# Patient Record
Sex: Male | Born: 1960 | Race: White | Hispanic: No | Marital: Married | Smoking: Never smoker
Health system: Southern US, Community
[De-identification: ages and names within clinical notes are randomized; demographics above are authoritative.]

## PROBLEM LIST (undated history)

## (undated) DIAGNOSIS — M1990 Unspecified osteoarthritis, unspecified site: Secondary | ICD-10-CM

## (undated) DIAGNOSIS — R7303 Prediabetes: Secondary | ICD-10-CM

## (undated) DIAGNOSIS — C801 Malignant (primary) neoplasm, unspecified: Secondary | ICD-10-CM

## (undated) HISTORY — PX: COLONOSCOPY: SHX174

## (undated) HISTORY — PX: SURGERY OF LIP: SUR1315

## (undated) HISTORY — PX: MASS EXCISION: SHX2000

## (undated) HISTORY — PX: RHINOPLASTY: SHX2354

---

## 2007-05-09 ENCOUNTER — Ambulatory Visit: Payer: Self-pay | Admitting: Unknown Physician Specialty

## 2008-01-08 ENCOUNTER — Ambulatory Visit: Payer: Self-pay | Admitting: Otolaryngology

## 2008-02-05 ENCOUNTER — Ambulatory Visit: Payer: Self-pay | Admitting: Otolaryngology

## 2018-02-27 ENCOUNTER — Other Ambulatory Visit: Payer: Self-pay

## 2018-02-27 ENCOUNTER — Emergency Department
Admission: EM | Admit: 2018-02-27 | Discharge: 2018-02-27 | Disposition: A | Discharge status: Home / Self Care | Payer: 59 | Attending: Emergency Medicine | Admitting: Emergency Medicine

## 2018-02-27 ENCOUNTER — Encounter: Payer: Self-pay | Admitting: Emergency Medicine

## 2018-02-27 ENCOUNTER — Emergency Department: Payer: 59

## 2018-02-27 DIAGNOSIS — F17210 Nicotine dependence, cigarettes, uncomplicated: Secondary | ICD-10-CM | POA: Diagnosis not present

## 2018-02-27 DIAGNOSIS — R0789 Other chest pain: Secondary | ICD-10-CM | POA: Diagnosis not present

## 2018-02-27 LAB — CBC
HEMATOCRIT: 45.7 % (ref 40.0–52.0)
HEMOGLOBIN: 15.1 g/dL (ref 13.0–18.0)
MCH: 30.2 pg (ref 26.0–34.0)
MCHC: 33 g/dL (ref 32.0–36.0)
MCV: 91.5 fL (ref 80.0–100.0)
PLATELETS: 296 10*3/uL (ref 150–440)
RBC: 4.99 MIL/uL (ref 4.40–5.90)
RDW: 13.1 % (ref 11.5–14.5)
WBC: 7.3 10*3/uL (ref 3.8–10.6)

## 2018-02-27 LAB — BASIC METABOLIC PANEL
Anion gap: 8 (ref 5–15)
BUN: 21 mg/dL — ABNORMAL HIGH (ref 6–20)
CHLORIDE: 103 mmol/L (ref 101–111)
CO2: 26 mmol/L (ref 22–32)
CREATININE: 1.15 mg/dL (ref 0.61–1.24)
Calcium: 9.8 mg/dL (ref 8.9–10.3)
GFR calc non Af Amer: >60 mL/min (ref >60)
Glucose, Bld: 135 mg/dL — ABNORMAL HIGH (ref 65–99)
POTASSIUM: 4.4 mmol/L (ref 3.5–5.1)
SODIUM: 137 mmol/L (ref 135–145)

## 2018-02-27 LAB — FIBRIN DERIVATIVES D-DIMER (ARMC ONLY): Fibrin derivatives D-dimer (ARMC): 430.94 ng/mL (FEU) (ref 0.00–499.00)

## 2018-02-27 LAB — TROPONIN I: Troponin I: <0.03 ng/mL (ref <0.03)

## 2018-02-27 NOTE — ED Provider Notes (Signed)
[facility] Emergency Department Provider Note ____________________________________________   I have reviewed the triage vital signs and the triage nursing note.  HISTORY  Chief Complaint Chest Pain   Historian Patient  HPI Mike Moss is a 57 y.o. male with no significant past medical history, does not have a primary care doctor, presenting for chest pain and some shortness of breath since yesterday.  Not associated with exertion.  He does work as a Curator, does not report any known overuse or traumatic injury.  States that yesterday he had some pain over the left anterior chest wall which seem to be worse with taking a big deep breath.  Not exactly short of breath, but did report slight catch.  No fever.  No coughing.  No wheezing.  He is never had this before.  No nasal congestion.  No vomiting.  No abdominal pain.   History reviewed. No pertinent past medical history.  There are no active problems to display for this patient.   History reviewed. No pertinent surgical history.  Prior to Admission medications   Not on File    No Known Allergies  No family history on file.  Social History Social History   Tobacco Use  . Smoking status: Current Every Day Smoker    Packs/day: 0.50    Types: Cigarettes  . Smokeless tobacco: Never Used  Substance Use Topics  . Alcohol use: Not on file  . Drug use: Not on file    Review of Systems  Constitutional: Negative for fever. Eyes: Negative for visual changes. ENT: Negative for sore throat. Cardiovascular: Positive as per HPI for chest pain/pleuritic discomfort. Respiratory: Negative for coughing or hemoptysis. Gastrointestinal: Negative for abdominal pain, vomiting and diarrhea. Genitourinary: Negative for dysuria. Musculoskeletal: Negative for back pain. Skin: Negative for rash. Neurological: Negative for headache.  ____________________________________________   PHYSICAL EXAM:  VITAL  SIGNS: ED Triage Vitals  Enc Vitals Group     BP 02/27/18 0853 124/82     Pulse Rate 02/27/18 0853 61     Resp 02/27/18 0853 18     Temp 02/27/18 0853 (!) 97.5 F (36.4 C)     Temp Source 02/27/18 0853 Oral     SpO2 02/27/18 0853 98 %     Weight 02/27/18 0852 230 lb (104.3 kg)     Height 02/27/18 0852 6' (1.829 m)     Head Circumference --      Peak Flow --      Pain Score 02/27/18 0852 4     Pain Loc --      Pain Edu? --      Excl. in GC? --      Constitutional: Alert and oriented. Well appearing and in no distress. HEENT   Head: Normocephalic and atraumatic.      Eyes: Conjunctivae are normal. Pupils equal and round.       Ears:         Nose: No congestion/rhinnorhea.   Mouth/Throat: Mucous membranes are moist.   Neck: No stridor. Cardiovascular/Chest: Normal rate, regular rhythm.  No murmurs, rubs, or gallops.  Normal appearance to the chest wall without skin rash.  No tender muscle spasm or chest wall to palpation. Respiratory: Normal respiratory effort without tachypnea nor retractions. Breath sounds are clear and equal bilaterally. No wheezes/rales/rhonchi.   Gastrointestinal: Soft. No distention, no guarding, no rebound. Nontender.    Genitourinary/rectal:Deferred Musculoskeletal: Nontender with normal range of motion in all extremities. No joint effusions.  No lower  extremity tenderness.  No edema. Neurologic:  Normal speech and language. No gross or focal neurologic deficits are appreciated. Skin:  Skin is warm, dry and intact. No rash noted. Psychiatric: Mood and affect are normal. Speech and behavior are normal. Patient exhibits appropriate insight and judgment.   ____________________________________________  LABS (pertinent positives/negatives) I, Governor Rooksebecca Korinna Tat, MD the attending physician have reviewed the labs noted below.  Labs Reviewed  BASIC METABOLIC PANEL - Abnormal; Notable for the following components:      Result Value   Glucose, Bld 135 (*)     BUN 21 (*)    All other components within normal limits  CBC  TROPONIN I  FIBRIN DERIVATIVES D-DIMER ([facility] ONLY)    ____________________________________________    EKG I, Governor Rooksebecca Chella Chapdelaine, MD, the attending physician have personally viewed and interpreted all ECGs.  60 bpm.  Normal sinus rhythm.  Narrow QRS.  Normal axis.  Normal ST and T wave ____________________________________________  RADIOLOGY   Chest x-ray reviewed by me: No infiltrate or edema  Interpretation by radiologist:IMPRESSION: No active cardiopulmonary disease. __________________________________________  PROCEDURES  Procedure(s) performed: None  Critical Care performed: None   ____________________________________________  ED COURSE / ASSESSMENT AND PLAN  Pertinent labs & imaging results that were available during my care of the patient were reviewed by me and considered in my medical decision making (see chart for details).   Patient with a nonspecific chest discomfort complaint, with stable vital signs.  No hypoxia.  Chest x-ray clear.  Symptoms do not seem consistent with bronchitis or asthma clinically.  Some pleuritic component, question chest wall versus PE although PE seems relatively unlikely.  I discussed with him initially going ahead with a chest CT to really put the issue to rest.  After discussion of risk and benefit of radiation exposure and risk analysis, patient would not like to do a CT today and would rather proceed with d-dimer testing I think this is reasonable he is in a low risk low likelihood category.  We will use d-dimer for risk stratification.  I am going to recommend he follow with a cardiologist, he does not have a primary care doctor and I will recommend a primary care doctor to follow-up with.  Ddimer negative.  Again discussed with patient, ok for outpatient follow up with office numbers provided.  DIFFERENTIAL DIAGNOSIS: Differential diagnosis includes, but is not  limited to, ACS, aortic dissection, pulmonary embolism, cardiac tamponade, pneumothorax, pneumonia, pericarditis, myocarditis, GI-related causes including esophagitis/gastritis, and musculoskeletal chest wall pain.    CONSULTATIONS: None   Patient / Family / Caregiver informed of clinical course, medical decision-making process, and agree with plan.   I discussed return precautions, follow-up instructions, and discharge instructions with patient and/or family.  Discharge Instructions : You are evaluated for chest discomfort, and although no certain cause was found, your exam and evaluation are overall reassuring in the emergency department today.  Return to the emergency room immediately for any worsening chest pain, palpitations, dizziness or lightheadedness, weakness, numbness, passing out, pain with breathing, coughing up blood, fever, or any other symptoms concerning to you.    ___________________________________________   FINAL CLINICAL IMPRESSION(S) / ED DIAGNOSES   Final diagnoses:  Atypical chest pain      ___________________________________________        Note: This dictation was prepared with Dragon dictation. Any transcriptional errors that result from this process are unintentional    Governor RooksLord, Jordanna Hendrie, MD 02/27/18 1101

## 2018-02-27 NOTE — ED Notes (Signed)
Patient ambulatory to lobby steady gait, NAD noted. Verbalized understanding of discharge instructions and follow-up care.

## 2018-02-27 NOTE — ED Triage Notes (Signed)
Pt c/o left side chest pain, states every time he takes a deep breath there is pain. When he coughs there is pain that radiates from left chest down left arm.   Pt reports shortness of breath yesterday, none currently. Ambulatory without difficulty in triage. NAD.

## 2018-02-27 NOTE — Discharge Instructions (Signed)
You are evaluated for chest discomfort, and although no certain cause was found, your exam and evaluation are overall reassuring in the emergency department today.  Return to the emergency room immediately for any worsening chest pain, palpitations, dizziness or lightheadedness, weakness, numbness, passing out, pain with breathing, coughing up blood, fever, or any other symptoms concerning to you.

## 2019-08-12 IMAGING — CR DG CHEST 2V
1 series · 2 of 2 positions shown · non-contrast
Comparison: None.

CLINICAL DATA: chest pain and shortness of breath

EXAM:
CHEST  2 VIEW

[Series 1: dg chest 2 view · 0.14mm/px · 2 of 2 slices shown]
[im 1/2]
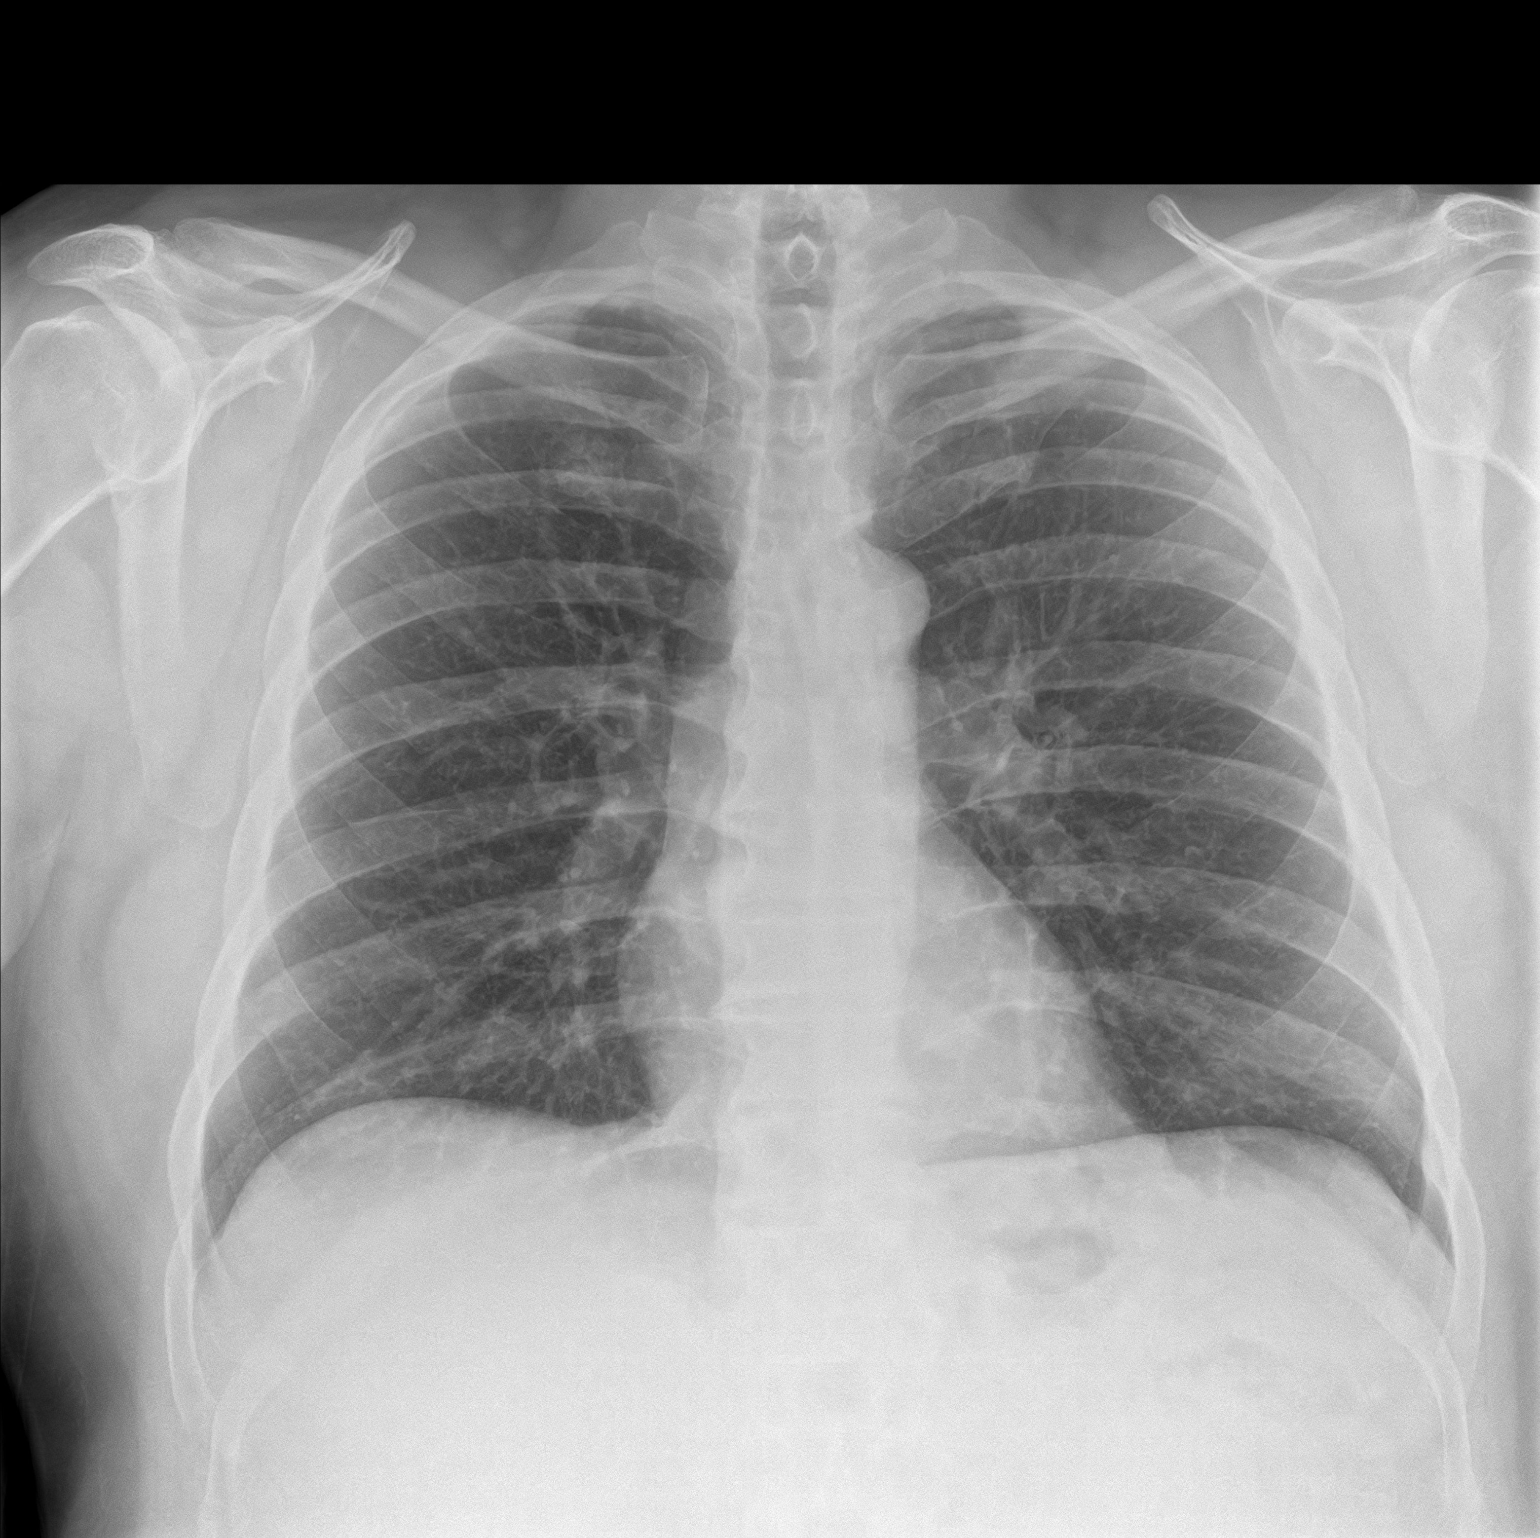
[im 2/2]
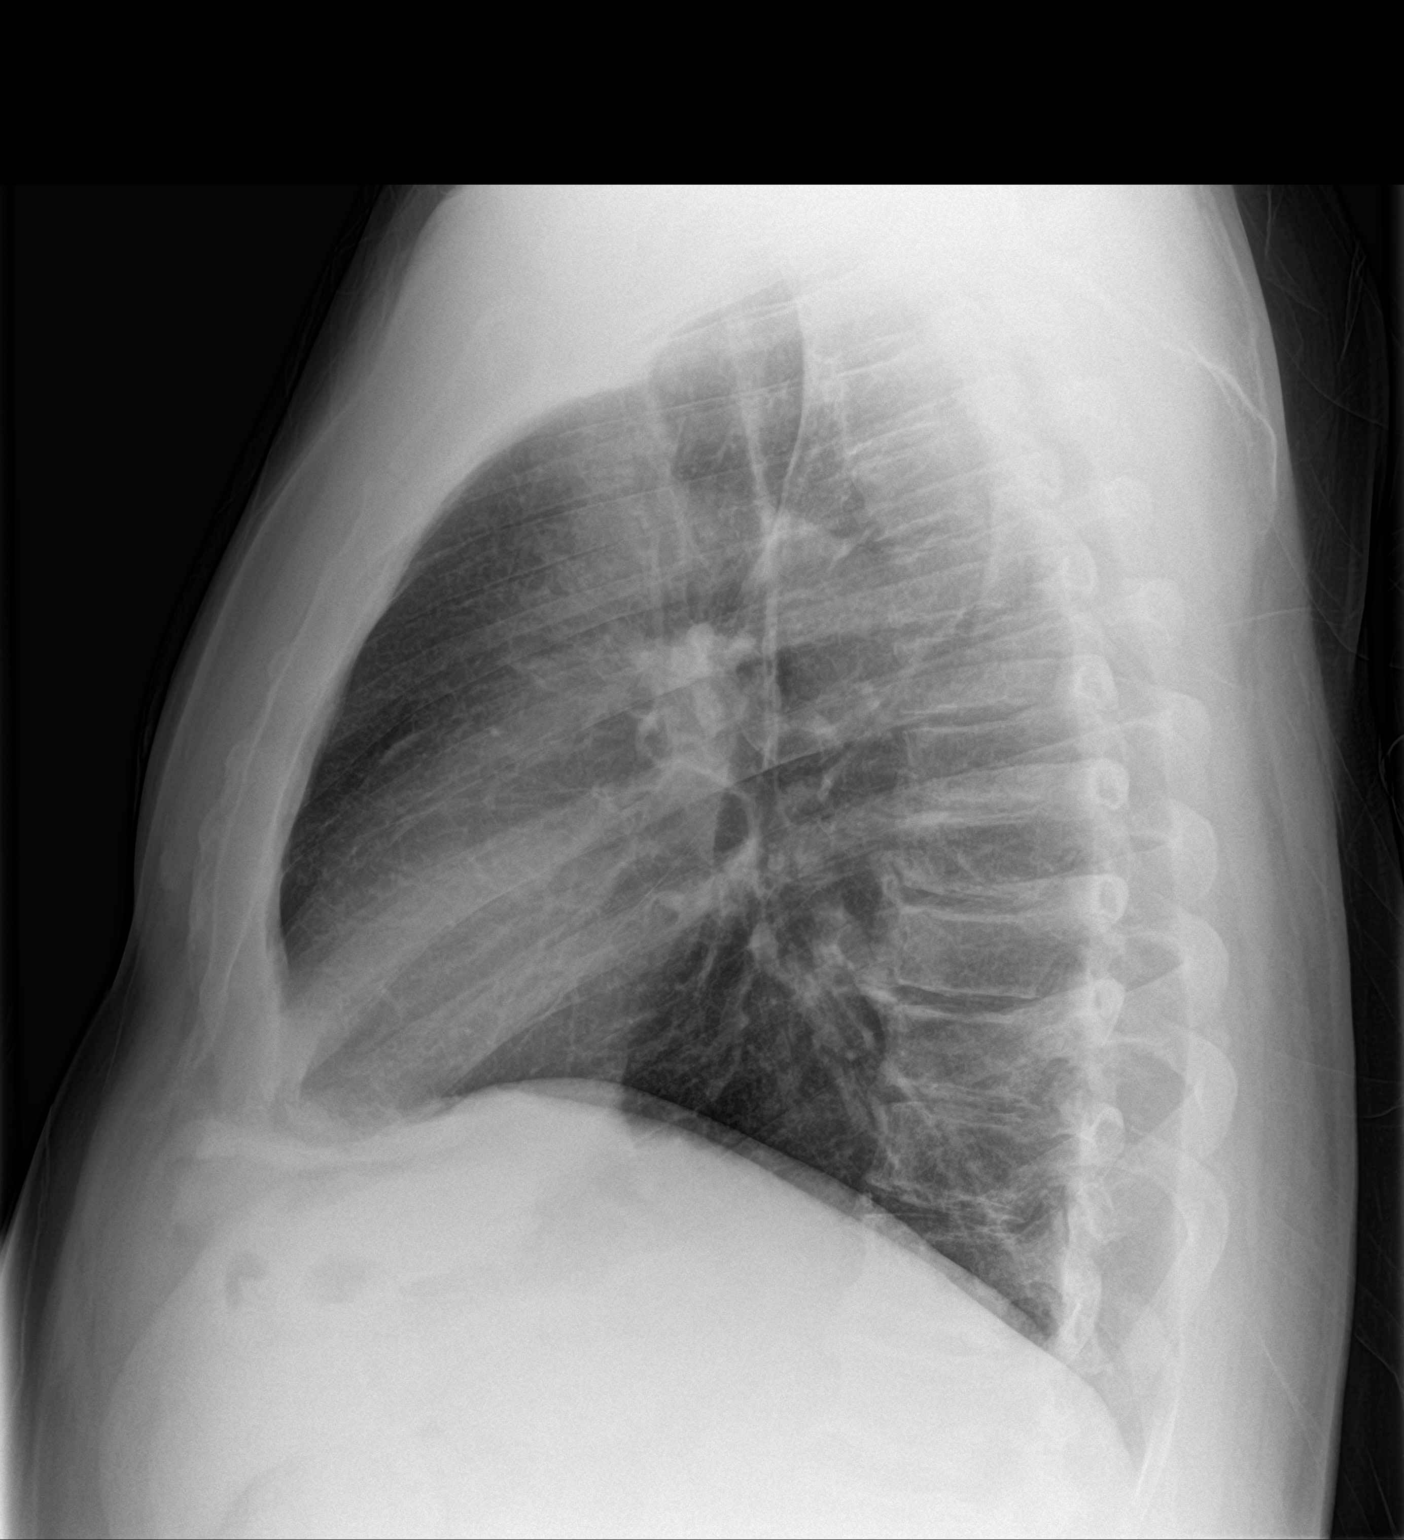

[2 of 2 positions shown; findings below may reference images not displayed]

FINDINGS: Normal heart size. Lungs clear. No pneumothorax. No pleural
effusion.
IMPRESSION: No active cardiopulmonary disease.

## 2024-08-12 ENCOUNTER — Ambulatory Visit
Admission: RE | Admit: 2024-08-12 | Discharge: 2024-08-12 | Disposition: A | Discharge status: Home / Self Care | Source: Ambulatory Visit

## 2024-08-12 ENCOUNTER — Other Ambulatory Visit: Payer: Self-pay

## 2024-08-12 DIAGNOSIS — R10813 Right lower quadrant abdominal tenderness: Secondary | ICD-10-CM | POA: Diagnosis present

## 2024-09-24 ENCOUNTER — Encounter: Payer: Self-pay | Admitting: Gastroenterology

## 2024-09-25 ENCOUNTER — Encounter: Payer: Self-pay | Admitting: Gastroenterology

## 2024-09-28 NOTE — Anesthesia Preprocedure Evaluation (Signed)
 Anesthesia Evaluation  Patient identified by MRN, date of birth, ID band Patient awake    Reviewed: Allergy & Precautions, H&P , NPO status , Patient's Chart, lab work & pertinent test results  Airway Mallampati: II  TM Distance: >3 FB Neck ROM: Full    Dental no notable dental hx.    Pulmonary neg pulmonary ROS   Pulmonary exam normal breath sounds clear to auscultation       Cardiovascular negative cardio ROS Normal cardiovascular exam Rhythm:Regular Rate:Normal     Neuro/Psych negative neurological ROS  negative psych ROS   GI/Hepatic negative GI ROS, Neg liver ROS,,,  Endo/Other  negative endocrine ROS    Renal/GU negative Renal ROS  negative genitourinary   Musculoskeletal negative musculoskeletal ROS (+) Arthritis ,    Abdominal   Peds negative pediatric ROS (+)  Hematology negative hematology ROS (+)   Anesthesia Other Findings Pre-diabetes Arthritis Cancer  Patient states   Reproductive/Obstetrics negative OB ROS                              Anesthesia Physical Anesthesia Plan  ASA: 3  Anesthesia Plan: General   Post-op Pain Management:    Induction: Intravenous  PONV Risk Score and Plan:   Airway Management Planned: Natural Airway and Nasal Cannula  Additional Equipment:   Intra-op Plan:   Post-operative Plan:   Informed Consent: I have reviewed the patients History and Physical, chart, labs and discussed the procedure including the risks, benefits and alternatives for the proposed anesthesia with the patient or authorized representative who has indicated his/her understanding and acceptance.     Dental Advisory Given  Plan Discussed with: Anesthesiologist, CRNA and Surgeon  Anesthesia Plan Comments: (Patient consented for risks of anesthesia including but not limited to:  - adverse reactions to medications - risk of airway placement if required -  damage to eyes, teeth, lips or other oral mucosa - nerve damage due to positioning  - sore throat or hoarseness - Damage to heart, brain, nerves, lungs, other parts of body or loss of life  Patient voiced understanding and assent.)        Anesthesia Quick Evaluation

## 2024-09-29 ENCOUNTER — Ambulatory Visit: Payer: Self-pay | Admitting: Anesthesiology

## 2024-09-29 ENCOUNTER — Encounter
Admission: RE | Disposition: A | Discharge status: Home / Self Care | Payer: Self-pay | Source: Home / Self Care | Attending: Gastroenterology

## 2024-09-29 ENCOUNTER — Other Ambulatory Visit: Payer: Self-pay

## 2024-09-29 ENCOUNTER — Encounter: Payer: Self-pay | Admitting: Gastroenterology

## 2024-09-29 ENCOUNTER — Ambulatory Visit
Admission: RE | Admit: 2024-09-29 | Discharge: 2024-09-29 | Disposition: A | Discharge status: Home / Self Care | Attending: Gastroenterology | Admitting: Gastroenterology

## 2024-09-29 DIAGNOSIS — Z1211 Encounter for screening for malignant neoplasm of colon: Secondary | ICD-10-CM | POA: Diagnosis present

## 2024-09-29 DIAGNOSIS — Z5329 Procedure and treatment not carried out because of patient's decision for other reasons: Secondary | ICD-10-CM | POA: Diagnosis not present

## 2024-09-29 HISTORY — DX: Unspecified osteoarthritis, unspecified site: M19.90

## 2024-09-29 HISTORY — DX: Prediabetes: R73.03

## 2024-09-29 HISTORY — DX: Malignant (primary) neoplasm, unspecified: C80.1

## 2024-09-29 SURGERY — COLONOSCOPY
Anesthesia: General

## 2024-09-29 MED ORDER — SODIUM CHLORIDE 0.9 % IV SOLN: INTRAVENOUS | Status: DC

## 2024-09-29 MED ORDER — LACTATED RINGERS IV SOLN: INTRAVENOUS | Status: DC

## 2024-09-29 MED ORDER — PROPOFOL 10 MG/ML IV BOLUS
INTRAVENOUS | Status: AC
  Filled 2024-09-29: qty 40

## 2024-09-29 SURGICAL SUPPLY — 16 items

## 2024-09-29 NOTE — Progress Notes (Signed)
 Patient left AMA. [place] he was tired of waiting and too hungry and left.  Appointment was for 11:40. Team was about to take patient for procedure at 11:49 but he felt unable to wait. Nurse removed patient's IV and he left. States he feels able to drive and go eat.

## 2024-09-29 NOTE — Progress Notes (Deleted)
 Patient stated tired of waiting, I need to eat.

## 2024-09-29 NOTE — Progress Notes (Signed)
 Patient canceled procedure and left, stated tired of waiting and I need to eat, informed patient he was next to have procedure but he refused to stay.

## 2024-09-29 NOTE — Progress Notes (Signed)
 Patient left before the procedure from pre-op. He said he couldn't wait any longer and he has to eat, he is about to passout. Tried to provide reassurance and he is not late for his scheduled procedure time  Corinn brooklyn, MD
